# Patient Record
Sex: Female | Born: 2000
Health system: Southern US, Community
[De-identification: ages and names within clinical notes are randomized; demographics above are authoritative.]

## PROBLEM LIST (undated history)

## (undated) DIAGNOSIS — J302 Other seasonal allergic rhinitis: Secondary | ICD-10-CM

---
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
## 2022-01-04 DIAGNOSIS — S92355D Nondisplaced fracture of fifth metatarsal bone, left foot, subsequent encounter for fracture with routine healing: Secondary | ICD-10-CM | POA: Diagnosis not present

## 2023-03-07 ENCOUNTER — Other Ambulatory Visit (HOSPITAL_BASED_OUTPATIENT_CLINIC_OR_DEPARTMENT_OTHER): Payer: Self-pay

## 2023-03-07 MED ORDER — INFLUENZA VIRUS VACC SPLIT PF (FLUZONE) 0.5 ML IM SUSY
0.5000 mL | PREFILLED_SYRINGE | Freq: Once | INTRAMUSCULAR | 0 refills | Status: AC
  Filled 2023-03-07: qty 0.5, 1d supply, fill #0

## 2024-01-25 ENCOUNTER — Other Ambulatory Visit (HOSPITAL_BASED_OUTPATIENT_CLINIC_OR_DEPARTMENT_OTHER): Payer: Self-pay

## 2024-01-25 MED ORDER — FLUZONE 0.5 ML IM SUSY
0.5000 mL | PREFILLED_SYRINGE | Freq: Once | INTRAMUSCULAR | 0 refills | Status: AC
  Filled 2024-01-25: qty 0.5, 1d supply, fill #0
